# Patient Record
Sex: Male | Born: 1995 | Hispanic: Yes | Marital: Single
Health system: Southern US, Community
[De-identification: ages and names within clinical notes are randomized; demographics above are authoritative.]

---

## 2016-07-26 ENCOUNTER — Emergency Department (HOSPITAL_COMMUNITY): Payer: Self-pay

## 2016-07-26 ENCOUNTER — Emergency Department (HOSPITAL_COMMUNITY)
Admission: EM | Admit: 2016-07-26 | Discharge: 2016-07-26 | Disposition: A | Discharge status: Home / Self Care | Payer: Self-pay | Attending: Emergency Medicine | Admitting: Emergency Medicine

## 2016-07-26 ENCOUNTER — Emergency Department: Payer: Self-pay

## 2016-07-26 ENCOUNTER — Encounter (HOSPITAL_COMMUNITY): Payer: Self-pay

## 2016-07-26 DIAGNOSIS — Y999 Unspecified external cause status: Secondary | ICD-10-CM | POA: Insufficient documentation

## 2016-07-26 DIAGNOSIS — X58XXXA Exposure to other specified factors, initial encounter: Secondary | ICD-10-CM | POA: Insufficient documentation

## 2016-07-26 DIAGNOSIS — R52 Pain, unspecified: Secondary | ICD-10-CM

## 2016-07-26 DIAGNOSIS — Y92009 Unspecified place in unspecified non-institutional (private) residence as the place of occurrence of the external cause: Secondary | ICD-10-CM | POA: Insufficient documentation

## 2016-07-26 DIAGNOSIS — Y9367 Activity, basketball: Secondary | ICD-10-CM | POA: Insufficient documentation

## 2016-07-26 DIAGNOSIS — S92401A Displaced unspecified fracture of right great toe, initial encounter for closed fracture: Secondary | ICD-10-CM | POA: Insufficient documentation

## 2016-07-26 DIAGNOSIS — S92911A Unspecified fracture of right toe(s), initial encounter for closed fracture: Secondary | ICD-10-CM

## 2016-07-26 DIAGNOSIS — S93104A Unspecified dislocation of right toe(s), initial encounter: Secondary | ICD-10-CM

## 2016-07-26 MED ORDER — TRAMADOL HCL 50 MG PO TABS: 50.0000 mg | ORAL_TABLET | Freq: Four times a day (QID) | ORAL | 0 refills | Status: AC | PRN

## 2016-07-26 MED ORDER — NAPROXEN 500 MG PO TABS: 500.0000 mg | ORAL_TABLET | Freq: Two times a day (BID) | ORAL | 0 refills | Status: AC

## 2016-07-26 MED ORDER — LIDOCAINE HCL (PF) 1 % IJ SOLN
10.0000 mL | Freq: Once | INTRAMUSCULAR | Status: AC
  Administered 2016-07-26: 10 mL via INTRADERMAL
  Filled 2016-07-26: qty 10

## 2016-07-26 NOTE — ED Triage Notes (Signed)
Patient here with right great toe injury on Friday while playing basketball. States that he had xray at [facility] and dislocated.

## 2016-07-26 NOTE — ED Provider Notes (Signed)
[facility]-EMERGENCY DEPT Provider Note   CSN: [id number] Arrival date & time: 07/26/16  1807   By signing my name below, I, Clovis PuAvnee Patel, attest that this documentation has been prepared under the direction and in the presence of  Kerrie BuffaloHope Isabelle Matt, NP. Electronically Signed: Clovis PuAvnee Patel, ED Scribe. 07/26/16. 6:45 PM.   History   Chief Complaint Chief Complaint  Patient presents with  . Foot Injury    The history is provided by the patient. No language interpreter was used.  Foot Injury   The incident occurred 2 days ago. The incident occurred at home. Injury mechanism: sports. The pain is present in the right toes (right big toe). The pain is moderate. The pain has been constant since onset. Pertinent negatives include no numbness. He has tried ice for the symptoms. The treatment provided no relief.   HPI Comments:  Andrew Mcclain is a 20 y.o. male who presents to the Emergency Department complaining of sudden onset, moderate right great toe pain with associated swelling s/p an injury which occurred 2 days ago. Pt states he was playing basketball when he jumped and landed on his toe which causes the injury. [facility] and was told his toe was dislocated and was advised to visit the ED. He has applied ice with no relief. Pt denies any other associated symptoms or modifying factors at this time.    History reviewed. No pertinent past medical history.  There are no active problems to display for this patient.   History reviewed. No pertinent surgical history.   Home Medications    Prior to Admission medications   Medication Sig Start Date End Date Taking? Authorizing Provider  naproxen (NAPROSYN) 500 MG tablet Take 1 tablet (500 mg total) by mouth 2 (two) times daily. 07/26/16   Reina Wilton Orlene OchM Dinara Lupu, NP  traMADol (ULTRAM) 50 MG tablet Take 1 tablet (50 mg total) by mouth every 6 (six) hours as needed. 07/26/16   Vonetta Foulk Orlene OchM Aleea Hendry, NP    Family History No family history on  file.  Social History Social History  Substance Use Topics  . Smoking status: Not on file  . Smokeless tobacco: Not on file  . Alcohol use Not on file     Allergies   Patient has no known allergies.   Review of Systems Review of Systems  Musculoskeletal: Positive for arthralgias and joint swelling.  Skin: Positive for color change (ecchymosis).  Neurological: Negative for numbness.     Physical Exam Updated Vital Signs BP 127/66 (BP Location: Left Arm)   Pulse 79   Temp 98.7 F (37.1 C) (Oral)   Resp 16   SpO2 98%   Physical Exam  Constitutional: He is oriented to person, place, and time. He appears well-developed and well-nourished. No distress.  HENT:  Head: Normocephalic and atraumatic.  Eyes: Conjunctivae are normal.  Cardiovascular: Normal rate.   Pulmonary/Chest: Effort normal.  Abdominal: He exhibits no distension.  Musculoskeletal: He exhibits edema and tenderness.  Right great toe swollen. Ecchymosis noted. Tender upon palpation and limited ROM noted.  Neurological: He is alert and oriented to person, place, and time.  Skin: Skin is warm and dry.  Psychiatric: He has a normal mood and affect.  Nursing note and vitals reviewed.    ED Treatments / Results  DIAGNOSTIC STUDIES:  Oxygen Saturation is 100% on RA, normal by my interpretation.    COORDINATION OF CARE:  6:44 PM Discussed treatment plan with pt at bedside and pt  agreed to plan. 8:32 PM Dr. Shaune Pollackameron Isaacs evaluated the pt and discussed appropriate treatment option. He also performed a digital block and dislocation reduction. See his procedure note.   Labs (all labs ordered are listed, but only abnormal results are displayed) Labs Reviewed - No data to display  Radiology Dg Toe Great Right  Result Date: 07/26/2016 CLINICAL DATA:  Post reduction of right great toe. EXAM: RIGHT GREAT TOE COMPARISON:  Radiographs of same day. FINDINGS: Successful reduction of previously described  dislocation involving first distal phalanx. Minimally displaced comminuted fracture is seen involving the lateral aspect of the proximal base of the first distal phalanx with intra-articular extension. IMPRESSION: Successful reduction of previously described dislocation involving first distal phalanx. Minimally displaced fracture is seen involving proximal base of first distal phalanx. Electronically Signed   By: Lupita RaiderJames  Green Jr, M.D.   On: 07/26/2016 21:57   Dg Toe Great Right  Result Date: 07/26/2016 CLINICAL DATA:  Injury to great toe with pain EXAM: RIGHT GREAT TOE COMPARISON:  None. FINDINGS: There is dorsal dislocation of the distal phalanx at the DIP joint. There is a comminuted fracture involving the lateral base of the first distal phalanx with intra-articular extension. IMPRESSION: Dorsal dislocation at the first DIP joint. Comminuted fracture involving the lateral base of first distal phalanx with articular extension. Electronically Signed   By: Jasmine PangKim  Fujinaga M.D.   On: 07/26/2016 20:12    Procedures Procedures (including critical care time)  Medications Ordered in ED Medications  lidocaine (PF) (XYLOCAINE) 1 % injection 10 mL (10 mLs Intradermal Given by Other 07/26/16 2022)     Initial Impression / Assessment and Plan / ED Course  I have reviewed the triage vital signs and the nursing notes.  Pertinent imaging results that were available during my care of the patient were reviewed by me and considered in my medical decision making (see chart for details).  Clinical Course     Patient X-Ray positive for obvious dislocation and fracture.  Pt advised to follow up with orthopedics. Patient given cam walker while in ED, conservative therapy recommended and discussed. Patient will be discharged home & is agreeable with above plan. Returns precautions discussed. Pt appears safe for discharge.   Final Clinical Impressions(s) / ED Diagnoses   Final diagnoses:  Dislocation of great  toe, right, closed, initial encounter  Closed fracture dislocation of interphalangeal joint of toe, right, initial encounter    New Prescriptions Discharge Medication List as of 07/26/2016  9:40 PM    START taking these medications   Details  naproxen (NAPROSYN) 500 MG tablet Take 1 tablet (500 mg total) by mouth 2 (two) times daily., Starting Sun 07/26/2016, Print    traMADol (ULTRAM) 50 MG tablet Take 1 tablet (50 mg total) by mouth every 6 (six) hours as needed., Starting Sun 07/26/2016, Print      I personally performed the services described in this documentation, which was scribed in my presence. The recorded information has been reviewed and is accurate.     [address]Emri Sample BlackM Lalani Winkles, [place]NP 07/27/16 [id number]    Shaune Pollackameron Isaacs, MD 07/28/16 [phone]

## 2016-07-26 NOTE — Progress Notes (Signed)
Orthopedic Tech Progress Note Patient Details:  Andrew Mcclain 1996/06/24 [id number]  Ortho Devices Type of Ortho Device: Crutches, CAM walker Ortho Device/Splint Location: RLE Ortho Device/Splint Interventions: Ordered, Application   Jennye MoccasinHughes, Andrew Mcclain 07/26/2016, 9:51 PM

## 2016-07-26 NOTE — Discharge Instructions (Signed)
Wear the boot until you follow up with the orthopedic doctor. Do not drive while taking the narcotic pain medication because it will make you sleepy.

## 2016-07-28 NOTE — ED Provider Notes (Signed)
Reduction of dislocation Date/Time: 07/28/2016 11:36 AM Performed by: Shaune PollackISAACS, Amarya Kuehl Authorized by: Shaune PollackISAACS, Jerauld Bostwick  Consent: Verbal consent obtained. Risks and benefits: risks, benefits and alternatives were discussed Consent given by: patient Patient understanding: patient states understanding of the procedure being performed Patient consent: the patient's understanding of the procedure matches consent given Procedure consent: procedure consent matches procedure scheduled Relevant documents: relevant documents present and verified Test results: test results available and properly labeled Site marked: the operative site was marked Imaging studies: imaging studies available Required items: required blood products, implants, devices, and special equipment available Patient identity confirmed: arm band Time out: Immediately prior to procedure a "time out" was called to verify the correct patient, procedure, equipment, support staff and site/side marked as required. Preparation: Patient was prepped and draped in the usual sterile fashion. Local anesthesia used: yes Anesthesia: digital block  Anesthesia: Local anesthesia used: yes Local Anesthetic: lidocaine 1% without epinephrine Anesthetic total: 10 mL  Sedation: Patient sedated: no Patient tolerance: Patient tolerated the procedure well with no immediate complications Comments: Gentle traction applied to toe followed by reduction, with palpable click into position and return to normal length. Distal strength, sensation, and ROM normal after reduction.       Shaune Pollackameron Cheryle Dark, MD 07/28/16 [phone]

## 2017-10-13 IMAGING — DX DG TOE GREAT 2+V*R*
3 series · 3 of 3 positions shown · non-contrast
Comparison: None.

CLINICAL DATA: Injury to great toe with pain

EXAM:
RIGHT GREAT TOE

[toe ap]
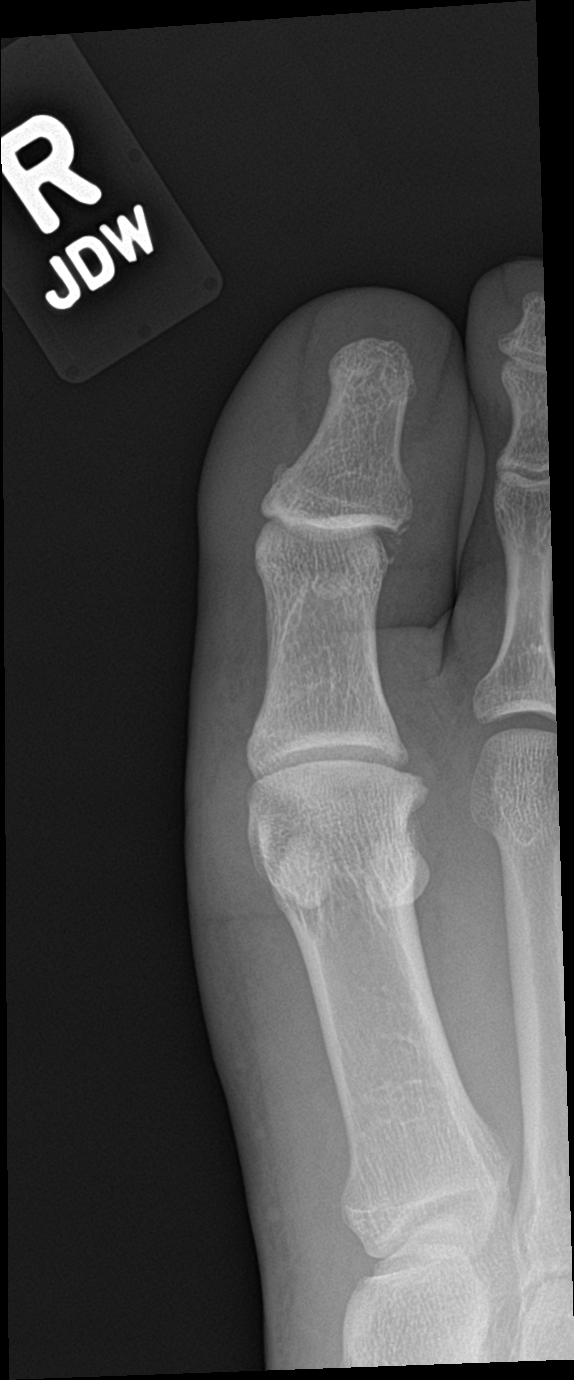

[toe obl]
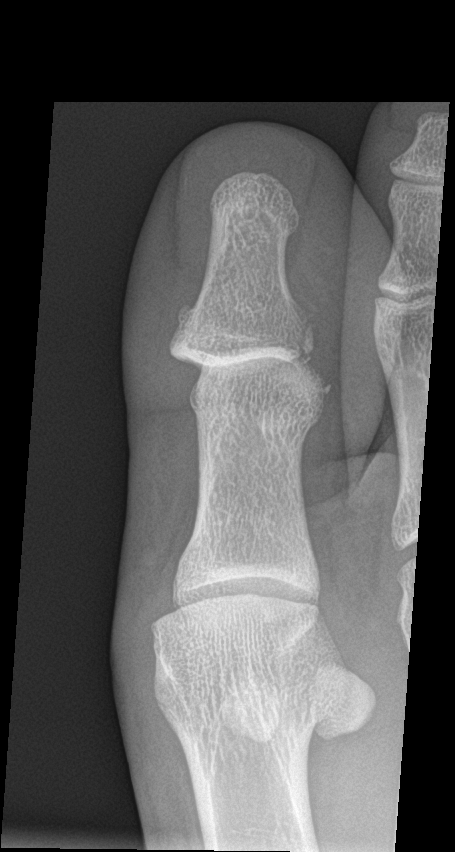

[toe lat]
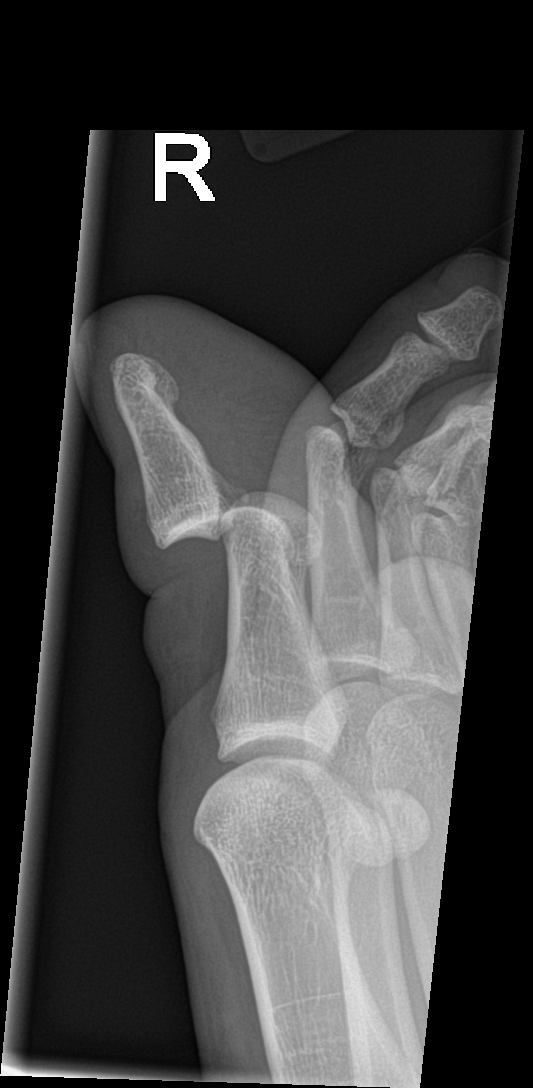

[3 of 3 positions shown; findings below may reference images not displayed]

FINDINGS: There is dorsal dislocation of the distal phalanx at the DIP joint.
There is a comminuted fracture involving the lateral base of the
first distal phalanx with intra-articular extension.
IMPRESSION: Dorsal dislocation at the first DIP joint. Comminuted fracture
involving the lateral base of first distal phalanx with articular
extension.
